# Patient Record
Sex: Male | Born: 1990 | Race: Black or African American | Hispanic: No | Marital: Single | Smoking: Current every day smoker
Health system: Southern US, Community
[De-identification: ages and names within clinical notes are randomized; demographics above are authoritative.]

---
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
## 2010-12-09 ENCOUNTER — Emergency Department (HOSPITAL_COMMUNITY)
Admission: EM | Admit: 2010-12-09 | Discharge: 2010-12-09 | Payer: Self-pay | Source: Home / Self Care | Admitting: Emergency Medicine

## 2010-12-10 ENCOUNTER — Emergency Department (HOSPITAL_COMMUNITY)
Admission: EM | Admit: 2010-12-10 | Discharge: 2010-12-10 | Payer: Self-pay | Source: Home / Self Care | Admitting: Emergency Medicine
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
## 2011-02-05 ENCOUNTER — Emergency Department (HOSPITAL_COMMUNITY)
Admission: EM | Admit: 2011-02-05 | Discharge: 2011-02-05 | Disposition: A | Discharge status: Home / Self Care | Payer: Self-pay | Attending: Emergency Medicine | Admitting: Emergency Medicine

## 2011-02-05 DIAGNOSIS — IMO0002 Reserved for concepts with insufficient information to code with codable children: Secondary | ICD-10-CM | POA: Insufficient documentation

## 2011-03-16 LAB — GC/CHLAMYDIA PROBE AMP, GENITAL
Chlamydia, DNA Probe: NEGATIVE
GC Probe Amp, Genital: NEGATIVE

## 2011-03-16 LAB — RPR: RPR Ser Ql: NONREACTIVE

## 2011-03-19 LAB — D-DIMER, QUANTITATIVE: D-Dimer, Quant: 0.27 ug/mL-FEU (ref 0.00–0.48)

## 2011-03-20 LAB — BASIC METABOLIC PANEL
BUN: 6 mg/dL (ref 6–23)
CO2: 26 mEq/L (ref 19–32)
Chloride: 99 mEq/L (ref 96–112)
Creatinine, Ser: 0.86 mg/dL (ref 0.4–1.5)
Potassium: 4 mEq/L (ref 3.5–5.1)

## 2011-03-20 LAB — CBC
HCT: 36.9 % (ref 36.0–49.0)
HCT: 37.4 % (ref 36.0–49.0)
HCT: 40.4 % (ref 36.0–49.0)
Hemoglobin: 12.7 g/dL (ref 12.0–16.0)
Hemoglobin: 15.4 g/dL (ref 12.0–16.0)
MCHC: 34.7 g/dL (ref 31.0–37.0)
MCV: 89.1 fL (ref 78.0–98.0)
MCV: 90.2 fL (ref 78.0–98.0)
Platelets: 217 10*3/uL (ref 150–400)
Platelets: 274 10*3/uL (ref 150–400)
Platelets: ADEQUATE 10*3/uL (ref 150–400)
RDW: 12.9 % (ref 11.4–15.5)
RDW: 13.4 % (ref 11.4–15.5)
RDW: 13.4 % (ref 11.4–15.5)
WBC: 7.7 10*3/uL (ref 4.5–13.5)
WBC: 7.8 10*3/uL (ref 4.5–13.5)

## 2011-03-20 LAB — TYPE AND SCREEN: ABO/RH(D): A POS

## 2011-03-20 LAB — DIFFERENTIAL
Basophils Absolute: 0.1 10*3/uL (ref 0.0–0.1)
Eosinophils Relative: 1 % (ref 0–5)
Lymphocytes Relative: 21 % — ABNORMAL LOW (ref 24–48)
Lymphs Abs: 1.6 10*3/uL (ref 1.1–4.8)
Monocytes Relative: 12 % — ABNORMAL HIGH (ref 3–11)

## 2011-03-20 LAB — PROTIME-INR
INR: 1 (ref 0.00–1.49)
Prothrombin Time: 13.2 s (ref 11.6–15.2)

## 2011-03-20 LAB — POCT I-STAT, CHEM 8
BUN: 8 mg/dL (ref 6–23)
Calcium, Ion: 1.08 mmol/L — ABNORMAL LOW (ref 1.12–1.32)
Chloride: 100 meq/L (ref 96–112)
Creatinine, Ser: 0.9 mg/dL (ref 0.4–1.5)
Glucose, Bld: 182 mg/dL — ABNORMAL HIGH (ref 70–99)
Potassium: 3.1 meq/L — ABNORMAL LOW (ref 3.5–5.1)
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
## 2011-09-02 LAB — CBC
HCT: 46.9
Hemoglobin: 16
RBC: 5.24
WBC: 12

## 2011-09-02 LAB — BLOOD GAS, ARTERIAL
Acid-base deficit: 0.6
Bicarbonate: 24.1 — ABNORMAL HIGH
Patient temperature: 37
TCO2: 20.9
pH, Arterial: 7.36

## 2011-09-02 LAB — BASIC METABOLIC PANEL
Calcium: 10
Glucose, Bld: 184 — ABNORMAL HIGH
Potassium: 3 — ABNORMAL LOW
Sodium: 139

## 2011-09-02 LAB — RAPID URINE DRUG SCREEN, HOSP PERFORMED
Amphetamines: NOT DETECTED
Barbiturates: NOT DETECTED
Benzodiazepines: NOT DETECTED
Cocaine: NOT DETECTED
Opiates: NOT DETECTED

## 2011-09-02 LAB — DIFFERENTIAL
Basophils Absolute: 0.1
Basophils Relative: 1
Eosinophils Relative: 1
Lymphocytes Relative: 35
Lymphs Abs: 4.2
Monocytes Relative: 8
Neutro Abs: 6.6

## 2011-09-02 LAB — ACETAMINOPHEN LEVEL: Acetaminophen (Tylenol), Serum: <10 — ABNORMAL LOW

## 2011-09-02 LAB — SALICYLATE LEVEL: Salicylate Lvl: <4

## 2021-07-11 ENCOUNTER — Encounter (HOSPITAL_COMMUNITY): Payer: Self-pay

## 2021-07-11 ENCOUNTER — Emergency Department (HOSPITAL_COMMUNITY)
Admission: EM | Admit: 2021-07-11 | Discharge: 2021-07-12 | Disposition: A | Discharge status: Home / Self Care | Payer: Self-pay | Attending: Emergency Medicine | Admitting: Emergency Medicine

## 2021-07-11 ENCOUNTER — Other Ambulatory Visit: Payer: Self-pay

## 2021-07-11 ENCOUNTER — Emergency Department (HOSPITAL_COMMUNITY)
Admission: EM | Admit: 2021-07-11 | Discharge: 2021-07-11 | Disposition: A | Discharge status: Home / Self Care | Payer: Self-pay | Attending: Emergency Medicine | Admitting: Emergency Medicine

## 2021-07-11 DIAGNOSIS — F17210 Nicotine dependence, cigarettes, uncomplicated: Secondary | ICD-10-CM | POA: Insufficient documentation

## 2021-07-11 DIAGNOSIS — R4689 Other symptoms and signs involving appearance and behavior: Secondary | ICD-10-CM

## 2021-07-11 DIAGNOSIS — F4325 Adjustment disorder with mixed disturbance of emotions and conduct: Secondary | ICD-10-CM | POA: Insufficient documentation

## 2021-07-11 DIAGNOSIS — Z5321 Procedure and treatment not carried out due to patient leaving prior to being seen by health care provider: Secondary | ICD-10-CM | POA: Insufficient documentation

## 2021-07-11 DIAGNOSIS — Y900 Blood alcohol level of less than 20 mg/100 ml: Secondary | ICD-10-CM | POA: Insufficient documentation

## 2021-07-11 DIAGNOSIS — R4182 Altered mental status, unspecified: Secondary | ICD-10-CM | POA: Insufficient documentation

## 2021-07-11 DIAGNOSIS — R45851 Suicidal ideations: Secondary | ICD-10-CM | POA: Insufficient documentation

## 2021-07-11 DIAGNOSIS — U071 COVID-19: Secondary | ICD-10-CM | POA: Insufficient documentation

## 2021-07-11 LAB — CBC WITH DIFFERENTIAL/PLATELET
Abs Immature Granulocytes: 0.02 10*3/uL (ref 0.00–0.07)
Basophils Absolute: 0.1 10*3/uL (ref 0.0–0.1)
Basophils Relative: 1 %
Eosinophils Absolute: 0.1 10*3/uL (ref 0.0–0.5)
Eosinophils Relative: 1 %
HCT: 47.4 % (ref 39.0–52.0)
Hemoglobin: 15.7 g/dL (ref 13.0–17.0)
Immature Granulocytes: 0 %
Lymphocytes Relative: 36 %
Lymphs Abs: 2.6 10*3/uL (ref 0.7–4.0)
MCH: 30.4 pg (ref 26.0–34.0)
MCHC: 33.1 g/dL (ref 30.0–36.0)
MCV: 91.9 fL (ref 80.0–100.0)
Monocytes Absolute: 0.6 10*3/uL (ref 0.1–1.0)
Monocytes Relative: 8 %
Neutro Abs: 3.8 10*3/uL (ref 1.7–7.7)
Neutrophils Relative %: 54 %
Platelets: 290 10*3/uL (ref 150–400)
RBC: 5.16 MIL/uL (ref 4.22–5.81)
RDW: 13.5 % (ref 11.5–15.5)
WBC: 7.1 10*3/uL (ref 4.0–10.5)
nRBC: 0 % (ref 0.0–0.2)

## 2021-07-11 LAB — COMPREHENSIVE METABOLIC PANEL
ALT: 13 U/L (ref 0–44)
AST: 18 U/L (ref 15–41)
Albumin: 4 g/dL (ref 3.5–5.0)
Alkaline Phosphatase: 59 U/L (ref 38–126)
Anion gap: 7 (ref 5–15)
BUN: 9 mg/dL (ref 6–20)
CO2: 24 mmol/L (ref 22–32)
Calcium: 8.8 mg/dL — ABNORMAL LOW (ref 8.9–10.3)
Chloride: 105 mmol/L (ref 98–111)
Creatinine, Ser: 0.87 mg/dL (ref 0.61–1.24)
GFR, Estimated: >60 mL/min (ref >60)
Glucose, Bld: 89 mg/dL (ref 70–99)
Potassium: 3.4 mmol/L — ABNORMAL LOW (ref 3.5–5.1)
Sodium: 136 mmol/L (ref 135–145)
Total Bilirubin: 1.4 mg/dL — ABNORMAL HIGH (ref 0.3–1.2)
Total Protein: 7.3 g/dL (ref 6.5–8.1)

## 2021-07-11 LAB — ETHANOL: Alcohol, Ethyl (B): <10 mg/dL (ref <10)

## 2021-07-11 NOTE — ED Notes (Signed)
Patient left. Unable to redirect patient to stay.

## 2021-07-11 NOTE — ED Triage Notes (Signed)
Pt brought in from home by cousin, who says he is living between her house and her sister's house. 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 Pt admits to having thoughts of HI, but will not elaborate. Family says they have taken out IVC paperwork

## 2021-07-11 NOTE — ED Notes (Signed)
Pt allowed lab to obtain blood work at this time. Pt continues to rest in the bed

## 2021-07-11 NOTE — ED Triage Notes (Signed)
Pt brought in from home by cousin, who says he is living between her house and her sister's house. 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 Pt admits to having thoughts of HI, but will not elaborate.

## 2021-07-11 NOTE — ED Notes (Signed)
Pt changed into burgundy scrubs. Personal belongings labeled et locked in locker per protocol. Scanned by security.

## 2021-07-11 NOTE — ED Provider Notes (Signed)
[facility] EMERGENCY DEPARTMENT Provider Note   CSN: [id number] Arrival date & time: 07/11/21  1859     History Chief Complaint  Patient presents with   Psychiatric Evaluation    [name] is a 30 y.o. male.  HPI Seen by me at 7:48 PM.  At this time he is lying quietly on a stretcher in the hallway.  I asked him something was bothering him and he shook his head yes but would not elaborate.  He would not answer any further questions.    Level 5 caveat-altered mental status    History reviewed. No pertinent past medical history.  There are no problems to display for this patient.   History reviewed. No pertinent surgical history.     No family history on file.  Social History   Tobacco Use   Smoking status: Every Day    Packs/day: 0.50    Types: Cigarettes   Smokeless tobacco: Never  Vaping Use   Vaping Use: Never used  Substance Use Topics   Alcohol use: Not Currently   Drug use: Yes    Types: Marijuana    Home Medications Prior to Admission medications   Not on File    Allergies    Patient has no known allergies.  Review of Systems   Review of Systems  Unable to perform ROS: Mental status change   Physical Exam Updated Vital Signs BP 129/79 (BP Location: Right Arm)   Pulse 77   Temp 98.7 F (37.1 C) (Oral)   Resp 18   Ht 5\' 11"  (1.803 m)   Wt 73.5 kg   SpO2 97%   BMI 22.59 kg/m   Physical Exam Vitals and nursing note reviewed.  Constitutional:      General: He is not in acute distress.    Appearance: He is well-developed.  HENT:     Head: Normocephalic and atraumatic.     Right Ear: External ear normal.     Left Ear: External ear normal.  Eyes:     Conjunctiva/sclera: Conjunctivae normal.  Neck:     Trachea: Phonation normal.  Cardiovascular:     Rate and Rhythm: Normal rate.  Pulmonary:     Effort: Pulmonary effort is normal.  Abdominal:     General: There is no distension.  Musculoskeletal:        General: Normal  range of motion.     Cervical back: Normal range of motion and neck supple.  Skin:    General: Skin is warm and dry.  Neurological:     Mental Status: He is alert and oriented to person, place, and time.     Cranial Nerves: No cranial nerve deficit.     Motor: No abnormal muscle tone.     Coordination: Coordination normal.  Psychiatric:        Attention and Perception: He is inattentive.        Mood and Affect: Mood is anxious.        Speech: He is noncommunicative.        Behavior: Behavior is agitated.        Cognition and Memory: Cognition is impaired.        Judgment: Judgment is impulsive.    ED Results / Procedures / Treatments   Labs (all labs ordered are listed, but only abnormal results are displayed) Labs Reviewed  CBC WITH DIFFERENTIAL/PLATELET    EKG None  Radiology No results found.  Procedures Procedures   Medications Ordered in ED  Medications - No data to display  ED Course  I have reviewed the triage vital signs and the nursing notes.  Pertinent labs & imaging results that were available during my care of the patient were reviewed by me and considered in my medical decision making (see chart for details).    MDM Rules/Calculators/A&P                            Patient Vitals for the past 24 hrs:  BP Temp Temp src Pulse Resp SpO2 Height Weight  07/11/21 1916 129/79 98.7 F (37.1 C) Oral 77 18 97 % 5\' 11"  (1.803 m) 73.5 kg      Medical Decision Making:  This patient is presenting for evaluation of unusual behavior according to family member who brought him here, which does require a range of treatment options, and is a complaint that involves a moderate risk of morbidity and mortality. The differential diagnoses include acute intoxication, psychiatric illness. I decided to review old records, and in summary patient without recent history here, presenting with concerning psychiatric symptoms.  He is a reluctant historian.  I  Critical  Interventions-clinical evaluation.  Testing ordered  After These Interventions, the Patient was reevaluated and was found to have eloped  CRITICAL CARE-no Performed by:  Nursing Notes Reviewed/ Care Coordinated Applicable Imaging Reviewed Interpretation of Laboratory Data incorporated into ED treatment      Final Clinical Impression(s) / ED Diagnoses Final diagnoses:  Abnormal behavior    Rx / DC Orders ED Discharge Orders     None        Mancel Bale, MD 07/11/21 2146

## 2021-07-12 DIAGNOSIS — F4325 Adjustment disorder with mixed disturbance of emotions and conduct: Secondary | ICD-10-CM

## 2021-07-12 LAB — RAPID URINE DRUG SCREEN, HOSP PERFORMED
Amphetamines: NOT DETECTED
Barbiturates: NOT DETECTED
Benzodiazepines: NOT DETECTED
Cocaine: NOT DETECTED
Opiates: NOT DETECTED
Tetrahydrocannabinol: POSITIVE — AB

## 2021-07-12 LAB — RESP PANEL BY RT-PCR (FLU A&B, COVID) ARPGX2
Influenza A by PCR: NEGATIVE
Influenza B by PCR: NEGATIVE
SARS Coronavirus 2 by RT PCR: POSITIVE — AB
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
## 2021-07-12 NOTE — Progress Notes (Signed)
CSW contacted [facility] in [place] and establish a intake appointment for the Pt for July 22, 2021 at 11 AM. CSW added this information to the AVS for discharge.   Damita Dunnings, MSW, LCSW-A  3:12 PM 07/12/2021

## 2021-07-12 NOTE — Consult Note (Signed)
Telepsych Consultation   Reason for Consult:  Psychiatry Reassessment Referring Physician:   Location of Patient: [facility] Location of Provider: Behavioral Health TTS Department  Patient Identification: [name] MRN:  [id number] Principal Diagnosis: Adjustment disorder with mixed disturbance of emotions and conduct Diagnosis:  Principal Problem:   Adjustment disorder with mixed disturbance of emotions and conduct   Total Time spent with patient: 30 minutes  Subjective:  "I am ready to go home, I am not going to hurt myself or anyone else."   HPI: [name] is a 30 y.o. male patient. Patient states "I do have some days when it is hard to keep my head up and yesterday was one of them." He verbalizes that he girlfriend did not answer her phone when he called yesterday resulting in him taking his aunt's car without permission. He was petitioned for involuntary commitment by family member after making suicidal statement.  He endorses he did make a suicidal statement, denies that he had any intent to harm self.  He states "in fact I am a Muslim and we do not do suicide."  Patient is Publishing rights manager. He is seated in assessment area, no acute distress.  He is alert and oriented, pleasant and cooperative during assessment. He reports euthymic mood with congruent affect. He makes jokes and shares laughs during assessment. He denies suicidal and homicidal ideations.  He denies any history of suicide attempts, denies history of self-harm.  He contracts verbally for safety with this Clinical research associate.  He has normal speech and behavior. He denies both auditory and visual hallucinations.  Patient is able to converse coherently with goal-directed thoughts and no distractibility or preoccupation.  He denies paranoia.  Objectively there is no evidence of psychosis/mania or delusional thinking.  [name] reports recent stressors include not speaking with his girlfriend on yesterday and  recently released from jail after 6 years in April 2022. He reports after speaking with girlfriend today his mood is much improved.  He denies any current outpatient psychiatry follow-up.  He denies any current medications.  He is open to initiation of medication to treat anxiety.  Currently he reports using marijuana daily, uses marijuana in an attempt to address his anxiety.  He is insightful regarding marijuana and it's affecting his mood, states "I feel like it brings me down."  He denies substance use aside from marijuana.  Discussed medication hydroxyzine, discussed side effects, patient verbalizes agreement with plan to initiate hydroxyzine.  He agrees to plan for follow-up with outpatient psychiatry.  He resides in [place] with his aunt and uncle.  He denies access to weapons.  He reports he is employed in [employer] "I like being outside."  He endorses average sleep and appetite.  Patient offered support and encouragement.  He gives verbal consent to speak with his aunt, [name].  Patient's [name] [phone] reports concern that patient needs structure after being released from prison in April 2022. Per aunt, [name] should have been released to halfway house initially and has a court order that permits him to reside in a halfway house but this has not been upheld by Engineer, drilling, Essie Hart [phone]. [name] has been in contact with probation officer frequently in an effort to place [name] in a halfway house. Patient's aunt reports he has been basically homeless and living with her sister for approximately 3 weeks. She reports concern that patient has engaged in reckless behavior continuously since discharge from prison.  She  states "he is manipulative and will tell you what you need to hear."  Additionally she reports Tyson BabinskiJarod is "using drugs."    Past Psychiatric History: none reported  Risk to Self:   Denies Risk to Others:   Denies Prior Inpatient  Therapy:   none reported Prior Outpatient Therapy:   None reported  Past Medical History: History reviewed. No pertinent past medical history. History reviewed. No pertinent surgical history. Family History: No family history on file. Family Psychiatric  History: brother- alcohol use disorder, substance use disorder Social History:  Social History   Substance and Sexual Activity  Alcohol Use Not Currently     Social History   Substance and Sexual Activity  Drug Use Yes   Types: Marijuana    Social History   Socioeconomic History   Marital status: Single    Spouse name: Not on file   Number of children: Not on file   Years of education: Not on file   Highest education level: Not on file  Occupational History   Not on file  Tobacco Use   Smoking status: Every Day    Packs/day: 0.50    Types: Cigarettes   Smokeless tobacco: Never  Vaping Use   Vaping Use: Never used  Substance and Sexual Activity   Alcohol use: Not Currently   Drug use: Yes    Types: Marijuana   Sexual activity: Not on file  Other Topics Concern   Not on file  Social History Narrative   Not on file   Social Determinants of Health   Financial Resource Strain: Not on file  Food Insecurity: Not on file  Transportation Needs: Not on file  Physical Activity: Not on file  Stress: Not on file  Social Connections: Not on file   Additional Social History:    Allergies:  No Known Allergies  Labs:  Results for orders placed or performed during the hospital encounter of 07/11/21 (from the past 48 hour(s))  Urine rapid drug screen (hosp performed)     Status: Abnormal   Collection Time: 07/11/21  9:48 PM  Result Value Ref Range   Opiates NONE DETECTED NONE DETECTED   Cocaine NONE DETECTED NONE DETECTED   Benzodiazepines NONE DETECTED NONE DETECTED   Amphetamines NONE DETECTED NONE DETECTED   Tetrahydrocannabinol POSITIVE (A) NONE DETECTED   Barbiturates NONE DETECTED NONE DETECTED    Comment:  (NOTE) DRUG SCREEN FOR MEDICAL PURPOSES ONLY.  IF CONFIRMATION IS NEEDED FOR ANY PURPOSE, NOTIFY LAB WITHIN 5 DAYS.  LOWEST DETECTABLE LIMITS FOR URINE DRUG SCREEN Drug Class                     Cutoff (ng/mL) Amphetamine and metabolites    1000 Barbiturate and metabolites    200 Benzodiazepine                 200 Tricyclics and metabolites     300 Opiates and metabolites        300 Cocaine and metabolites        300 THC                            50 Performed at [facility], [address] [place], [place] [address]   Comprehensive metabolic panel     Status: Abnormal   Collection Time: 07/11/21 10:01 PM  Result Value Ref Range   Sodium 136 135 - 145 mmol/L   Potassium 3.4 (L)  3.5 - 5.1 mmol/L   Chloride 105 98 - 111 mmol/L   CO2 24 22 - 32 mmol/L   Glucose, Bld 89 70 - 99 mg/dL    Comment: Glucose reference range applies only to samples taken after fasting for at least 8 hours.   BUN 9 6 - 20 mg/dL   Creatinine, Ser 0.34 0.61 - 1.24 mg/dL   Calcium 8.8 (L) 8.9 - 10.3 mg/dL   Total Protein 7.3 6.5 - 8.1 g/dL   Albumin 4.0 3.5 - 5.0 g/dL   AST 18 15 - 41 U/L   ALT 13 0 - 44 U/L   Alkaline Phosphatase 59 38 - 126 U/L   Total Bilirubin 1.4 (H) 0.3 - 1.2 mg/dL   GFR, Estimated >74 >25 mL/min    Comment: (NOTE) Calculated using the CKD-EPI Creatinine Equation (2021)    Anion gap 7 5 - 15    Comment: Performed at [facility], [address], [place], [place] [address]  Ethanol     Status: None   Collection Time: 07/11/21 10:01 PM  Result Value Ref Range   Alcohol, Ethyl (B) <10 <10 mg/dL    Comment: (NOTE) Lowest detectable limit for serum alcohol is 10 mg/dL.  For medical purposes only. Performed at [facility], [address], [place], [place] [address]   CBC with Differential     Status: None   Collection Time: 07/11/21 10:01 PM  Result Value Ref Range   WBC 7.1 4.0 - 10.5 K/uL   RBC 5.16 4.22 - 5.81 MIL/uL   Hemoglobin 15.7 13.0 - 17.0 g/dL   HCT 32.9 51.8 -  84.1 %   MCV 91.9 80.0 - 100.0 fL   MCH 30.4 26.0 - 34.0 pg   MCHC 33.1 30.0 - 36.0 g/dL   RDW 66.0 63.0 - 16.0 %   Platelets 290 150 - 400 K/uL   nRBC 0.0 0.0 - 0.2 %   Neutrophils Relative % 54 %   Neutro Abs 3.8 1.7 - 7.7 K/uL   Lymphocytes Relative 36 %   Lymphs Abs 2.6 0.7 - 4.0 K/uL   Monocytes Relative 8 %   Monocytes Absolute 0.6 0.1 - 1.0 K/uL   Eosinophils Relative 1 %   Eosinophils Absolute 0.1 0.0 - 0.5 K/uL   Basophils Relative 1 %   Basophils Absolute 0.1 0.0 - 0.1 K/uL   Immature Granulocytes 0 %   Abs Immature Granulocytes 0.02 0.00 - 0.07 K/uL    Comment: Performed at [facility], [address], [place], [place] [address]  Resp Panel by RT-PCR (Flu A&B, Covid) Nasopharyngeal Swab     Status: Abnormal   Collection Time: 07/12/21  1:17 AM   Specimen: Nasopharyngeal Swab; Nasopharyngeal(NP) swabs in vial transport medium  Result Value Ref Range   SARS Coronavirus 2 by RT PCR POSITIVE (A) NEGATIVE    Comment: RESULT CALLED TO, READ BACK BY AND VERIFIED WITH: BELINDA BROWN 07/12/21 @ 0306 BY S BEARD (NOTE) SARS-CoV-2 target nucleic acids are DETECTED.  The SARS-CoV-2 RNA is generally detectable in upper respiratory specimens during the acute phase of infection. Positive results are indicative of the presence of the identified virus, but do not rule out bacterial infection or co-infection with other pathogens not detected by the test. Clinical correlation with patient history and other diagnostic information is necessary to determine patient infection status. The expected result is Negative.  Fact Sheet for Patients: BloggerCourse.com  Fact Sheet for Healthcare Providers: SeriousBroker.it  This test is  not yet approved or cleared by the [place] and  has been authorized for detection and/or diagnosis of SARS-CoV-2 by FDA under an Emergency Use Authorization (EUA).  This EUA will remain in effect  (meaning this test c an be used) for the duration of  the COVID-19 declaration under Section 564(b)(1) of the Act, 21 U.S.C. section 360bbb-3(b)(1), unless the authorization is terminated or revoked sooner.     Influenza A by PCR NEGATIVE NEGATIVE   Influenza B by PCR NEGATIVE NEGATIVE    Comment: (NOTE) The Xpert Xpress SARS-CoV-2/FLU/RSV plus assay is intended as an aid in the diagnosis of influenza from Nasopharyngeal swab specimens and should not be used as a sole basis for treatment. Nasal washings and aspirates are unacceptable for Xpert Xpress SARS-CoV-2/FLU/RSV testing.  Fact Sheet for Patients: BloggerCourse.com  Fact Sheet for Healthcare Providers: SeriousBroker.it  This test is not yet approved or cleared by the [place] FDA and has been authorized for detection and/or diagnosis of SARS-CoV-2 by FDA under an Emergency Use Authorization (EUA). This EUA will remain in effect (meaning this test can be used) for the duration of the COVID-19 declaration under Section 564(b)(1) of the Act, 21 U.S.C. section 360bbb-3(b)(1), unless the authorization is terminated or revoked.  Performed at [facility], [address], [place], [place] [address]     Medications:  No current facility-administered medications for this encounter.   No current outpatient medications on file.    Musculoskeletal: Strength & Muscle Tone: within normal limits Gait & Station: normal Patient leans: N/A          Psychiatric Specialty Exam:  Presentation  General Appearance:  No data recorded Eye Contact: No data recorded Speech: No data recorded Speech Volume: No data recorded Handedness: No data recorded  Mood and Affect  Mood: No data recorded Affect: No data recorded  Thought Process  Thought Processes: No data recorded Descriptions of Associations:No data recorded Orientation:No data recorded Thought  Content:No data recorded History of Schizophrenia/Schizoaffective disorder:No data recorded Duration of Psychotic Symptoms:No data recorded Hallucinations:No data recorded Ideas of Reference:No data recorded Suicidal Thoughts:No data recorded Homicidal Thoughts:No data recorded  Sensorium  Memory: No data recorded Judgment: No data recorded Insight: No data recorded  Executive Functions  Concentration: No data recorded Attention Span: No data recorded Recall: No data recorded Fund of Knowledge: No data recorded Language: No data recorded  Psychomotor Activity  Psychomotor Activity: No data recorded  Assets  Assets: No data recorded  Sleep  Sleep: No data recorded   Physical Exam: Physical Exam Vitals and nursing note reviewed.  Constitutional:      Appearance: Normal appearance. He is normal weight.  HENT:     Head: Normocephalic and atraumatic.     Nose: Nose normal.  Cardiovascular:     Rate and Rhythm: Normal rate.  Pulmonary:     Effort: Pulmonary effort is normal.  Musculoskeletal:        General: Normal range of motion.     Cervical back: Normal range of motion and neck supple.  Neurological:     Mental Status: He is alert and oriented to person, place, and time.  Psychiatric:        Attention and Perception: Attention and perception normal.        Mood and Affect: Mood and affect normal.        Speech: Speech normal.        Behavior: Behavior normal. Behavior is cooperative.  Thought Content: Thought content normal.        Cognition and Memory: Cognition and memory normal.        Judgment: Judgment normal.   Review of Systems  Constitutional: Negative.   HENT: Negative.    Eyes: Negative.   Respiratory: Negative.    Cardiovascular: Negative.   Gastrointestinal: Negative.   Genitourinary: Negative.   Musculoskeletal: Negative.   Skin: Negative.   Neurological: Negative.   Endo/Heme/Allergies: Negative.    Psychiatric/Behavioral:  Positive for substance abuse.   Blood pressure 132/73, pulse 60, temperature 98.3 F (36.8 C), temperature source Oral, resp. rate 20, height  (1.803 m), weight 73.5 kg, SpO2 100 %. Body mass index is 22.6 kg/m.  Treatment Plan Summary: Plan patient reviewed with Dr Nelly Rout Follow up with outpatient psychiatry, resources provided. Recommend consider medication: -Hydroxyzine 25 mg 3 times daily as needed/anxiety  Follow-up with outpatient substance use treatment resources provided.   Disposition: No evidence of imminent risk to self or others at present.   Patient does not meet criteria for psychiatric inpatient admission. Supportive therapy provided about ongoing stressors. Discussed crisis plan, support from social network, calling 911, coming to the Emergency Department, and calling Suicide Hotline.  This service was provided via telemedicine using a 2-way, interactive audio and video technology.  Names of all persons participating in this telemedicine service and their role in this encounter. Name: Shelbie Ammons Role: Patient  Name: Elliot Cousin telephone Role: Patient's cousin  Name: Doran Heater Role: FNP  Name: Dr Lucianne Muss Role: Psychiatrist    Lenard Lance, FNP 07/12/2021 2:25 PM

## 2021-07-12 NOTE — ED Notes (Signed)
Dr. Hyacinth Meeker notified patient that he was being discharged for follow-up as outpatient

## 2021-07-12 NOTE — ED Notes (Signed)
Pt scratches cleaned and dressed. Pt was laughing with nurse proud of his behavior.

## 2021-07-12 NOTE — ED Notes (Signed)
Patient once again is at door stating he was not  using bathroom and not bedside commode.   He also stated he was Walking out   Security and police were called

## 2021-07-12 NOTE — ED Provider Notes (Signed)
[facility]-EMERGENCY DEPT [facility] Emergency Department Provider Note MRN:  [id number]  Arrival date & time: 07/12/21     Chief Complaint   Psychiatric Evaluation   History of Present Illness   [name] is a 30 y.o. year-old male with no pertinent past medical history presenting to the ED with chief complaint of psychiatric evaluation.  IVC paperwork obtained and brought here for evaluation.  Per the paperwork, patient was planning on robbing a bank, was planning on starting a fight or assaulting someone, was planning on killing someone or being killed.  Explained that he would not stop until he was killed.  Here in the emergency department patient refuses to cooperate or answer questions.  I was unable to obtain an accurate HPI, PMH, or ROS due to the patient's unstable psychiatric condition.  Level 5 caveat.  Review of Systems  Positive for SI, HI.  Patient's Health History   History reviewed. No pertinent past medical history.  History reviewed. No pertinent surgical history.  No family history on file.  Social History   Socioeconomic History   Marital status: Single    Spouse name: Not on file   Number of children: Not on file   Years of education: Not on file   Highest education level: Not on file  Occupational History   Not on file  Tobacco Use   Smoking status: Every Day    Packs/day: 0.50    Types: Cigarettes   Smokeless tobacco: Never  Vaping Use   Vaping Use: Never used  Substance and Sexual Activity   Alcohol use: Not Currently   Drug use: Yes    Types: Marijuana   Sexual activity: Not on file  Other Topics Concern   Not on file  Social History Narrative   Not on file   Social Determinants of Health   Financial Resource Strain: Not on file  Food Insecurity: Not on file  Transportation Needs: Not on file  Physical Activity: Not on file  Stress: Not on file  Social Connections: Not on file  Intimate Partner Violence: Not on file      Physical Exam   Vitals:   07/11/21 2048 07/11/21 2231  BP: 129/79 107/67  Pulse: 75 (!) 53  Resp: 18 16  Temp: 98.3 F (36.8 C)   SpO2: 97% 98%    CONSTITUTIONAL: Well-appearing, NAD NEURO:  Alert and oriented x 3, no focal deficits EYES:  eyes equal and reactive ENT/NECK:  no LAD, no JVD CARDIO: Regular rate, well-perfused, normal S1 and S2 PULM:  CTAB no wheezing or rhonchi GI/GU:  normal bowel sounds, non-distended, non-tender MSK/SPINE:  No gross deformities, no edema SKIN:  no rash, atraumatic PSYCH: Withdrawn speech and behavior  *Additional and/or pertinent findings included in MDM below  Diagnostic and Interventional Summary    EKG Interpretation  Date/Time:    Ventricular Rate:    PR Interval:    QRS Duration:   QT Interval:    QTC Calculation:   R Axis:     Text Interpretation:         Labs Reviewed  COMPREHENSIVE METABOLIC PANEL - Abnormal; Notable for the following components:      Result Value   Potassium 3.4 (*)    Calcium 8.8 (*)    Total Bilirubin 1.4 (*)    All other components within normal limits  RESP PANEL BY RT-PCR (FLU A&B, COVID) ARPGX2  ETHANOL  CBC WITH DIFFERENTIAL/PLATELET  RAPID URINE DRUG SCREEN, HOSP PERFORMED  No orders to display    Medications - No data to display   Procedures  /  Critical Care Procedures  ED Course and Medical Decision Making  I have reviewed the triage vital signs, the nursing notes, and pertinent available records from the EMR.  Listed above are laboratory and imaging tests that I personally ordered, reviewed, and interpreted and then considered in my medical decision making (see below for details).  Concerning violent plans with SI.  The IVC paperwork.  Patient is sitting calmly here in the emergency department.  First exam paperwork complete, patient is medically cleared, awaiting TTS evaluation.  Signed out to default provider.       Elmer Sow. Pilar Plate, MD [facility] Emergency  Medicine [facility] [email or web address]  Final Clinical Impressions(s) / ED Diagnoses     ICD-10-CM   1. Suicidal ideation  R45.851       ED Discharge Orders     None        Discharge Instructions Discussed with and Provided to Patient:   Discharge Instructions   None       Sabas Sous, MD 07/12/21 [id number]

## 2021-07-12 NOTE — ED Notes (Signed)
Pt ran outside after he went to bathroom. Redirection was not helpful to get him back to the room. Nurse followed pt to make sure he was safe. Nurse noted outside in the parkinglot that it was 1500 hrs. Pt laughed and looked and nurse and stated' We will see how many it takes to get me back in there" . Nurse attempted redirection but was not effective.  Police brought patient back into the room. Police with pt talking at this time.

## 2021-07-12 NOTE — ED Notes (Signed)
Pt requesting security. Nurse went into room to discuss needs for security. Nurse explained to pt that," yes he is IVC'D and can not leave " and " yes he had covid, "  so we would need to put a bedside commode in his room to prevent the spread of covid. Pt was calm and accepting.

## 2021-07-12 NOTE — BH Assessment (Signed)
Clinician spoke to [name] with [facility] [employer] to discuss information from pt. Pt told clinician that he loved [name], "what if somebody kill her." Pt reports, his ex lives on [address] and her phone number is [phone]. Clinician expressed to [name] the ex needs to Duty to Warn.   Clinician discussed Duty to Warn with the pt. Pt reports, he will not hurt her, he was just using the system.    Redmond Pulling, MS, [facility], [facility] Triage Specialist [phone]

## 2021-07-12 NOTE — ED Notes (Signed)
Pt belongings returned to him from locker.

## 2021-07-12 NOTE — ED Notes (Signed)
Patient came to door and stated before he became a psych patient he was going to come in because his back hurt.   Dr. Hyacinth Meeker and nurse advised.

## 2021-07-12 NOTE — ED Notes (Signed)
Patient ran and [place] PD brought patient back handcuffed

## 2021-07-12 NOTE — ED Notes (Signed)
Patient used phone again.  Patient advised this was his second call for the day Patient verbalized understanding

## 2021-07-12 NOTE — ED Provider Notes (Signed)
I have personally spoken with the psychiatrist, unfortunately I was contacted about 10 minutes after the patient was cleared, he decided to run out the front doors to get away at that point, he stated he was not suicidal, psychiatry is adamant that this patient is not a danger to himself or others, that his behavior is not being driven by a psychiatric problem but just having generally poor choices, at this time the patient's IVC will be overturned   Eber Hong, MD 07/12/21 [phone]

## 2021-07-12 NOTE — Progress Notes (Signed)
TTS requested that [facility] fax psych assessment, ed provider notes, and AVS to [facility] at [phone].

## 2021-07-12 NOTE — ED Notes (Signed)
Pt moved to room 16 due to positive covid result. Pt visible through glass doors. Room also locked down

## 2021-07-12 NOTE — ED Notes (Signed)
Lunch tray given to patient   Patient resting comfortably

## 2021-07-12 NOTE — ED Notes (Signed)
Pt ambulated to restroom without any incident pt also left urine that was sent to lab for testing. [name]
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
## 2021-07-12 NOTE — ED Notes (Signed)
Patient used phone (1st call for day)  Breakast tray was served.  Finger foods only
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
## 2021-07-12 NOTE — Discharge Instructions (Addendum)
Patient has been schedule for an intake with [facility] at their [place] location. See additional information below:  [facility]  [address] [place], [place] [address] [phone] APPOINTMENT TIME IS: Monday July 22, 2021 at 11 AM PLEASE BRING:   Photo ID   SSI Card  Proof of Income (if able)   Insurance Card (if able)   Substance Abuse Treatment Resources listed Below:  [facility] - Admissions are currently completed Monday through Friday at 8am; both appointments and walk-ins are accepted.  Any individual that is a [facility] resident may present for a substance abuse screening and assessment for admission.  A person may be referred by numerous sources or self-refer.   Potential clients will be screened for medical necessity and appropriateness for the program.  Clients must meet criteria for high-intensity residential treatment services.  If clinically appropriate, a client will continue with the comprehensive clinical assessment and intake process, as well as enrollment in the [facility].  Address: [address] [place], [place] [address] Admin Hours: Bettye Boeck to [facility] Hours: 24/7 Phone: [phone] Fax: [phone]  [facility] - [facility] Address: [address] [place], [place], [place] [address] Behavioral Health Urgent Care [facility]) Hours: 24/7 Phone: [phone] Fax: [phone]  [facility]): (offers outpatient therapy and intensive outpatient substance abuse therapy).  [address] [place], [place] [address] Phone: [phone]  [facility]: Offers FREE recovery skills classes, support groups, 1:1 Peer Support, and Compeer Classes. [address], [place], [place] [address] Phone: [phone] (Call to complete intake).   [facility] [address] [place], [place] [address] Phone: [phone] ext [phone] The  [facility] provides food, shelter and other programs and services to the homeless men of [place] through our [employer] program.  By offering safe shelter, three meals a day, clean clothing, Biblical counseling, financial planning, vocational training, GED/education and employment assistance, we've helped mend the shattered lives of many homeless men since opening in 1974.  We have approximately 267 beds available, with a max of 312 beds including mats for emergency situations and currently house an average of 270 men a night.  Prospective Client Check-In Information Photo ID Required (State/ Out of State/ [facility]) - if photo ID is not available, clients are required to have a printout of a police/sheriff's criminal history report. Help out with chores around the [facility]. No sex offender of any type (pending, charged, registered and/or any other sex related offenses) will be permitted to check in. Must be willing to abide by all rules, regulations, and policies established by the [employer]. The following will be provided - shelter, food, clothing, and biblical counseling. If you or someone you know is in need of assistance at our [facility] shelter in [place], [place], please call [phone] ext. [phone].  [facility]-will provide timely access to mental health services for children and adolescents (4-17) and adults presenting in a mental health crisis. The program is designed for those who need urgent Behavioral Health or Substance Use treatment and are not experiencing a medical crisis that would typically require an emergency room visit.    [address] [place], [place] [address] Phone: [phone] [email or web address]  [facility]: Phone#: [phone]  The [facility] SA Intensive Outpatient Program (SAIOP) means structured individual and group addiction activities and services that are provided  at an outpatient program designed  to assist adult and adolescent consumers to begin recovery and learn skills for recovery maintenance. The [facility] [facility] SAIOP program is offered at least 3 hours a day, 3 days a week.SAIOP services shall include a structured program consisting of, but not limited to, the following services: Individual counseling and support; Group counseling and support; Family counseling, training or support; Biochemical assays to identify recent drug use (e.g., urine drug screens); Strategies for relapse prevention to include community and social support systems in treatment; Life skills; Crisis contingency planning; Disease Management; and Treatment support activities that have been adapted or specifically designed for persons with physical disabilities, or persons with co-occurring disorders of mental illness and substance abuse/dependence or mental retardation/developmental disability and substance abuse/dependence. Phone: [phone]  Address:   [facility] will also offer the following outpatient services: (Monday through Friday 8am-5pm)   Partial Hospitalization Program (PHP) Substance Abuse Intensive Outpatient Program (SA-IOP) Group Therapy Medication Management Peer Living Room We also provide (24/7):  Assessments: Our mental health clinician and providers will conduct a focused mental health evaluation, assessing for immediate safety concerns and further mental health needs. Referral: Our team will provide resources and help connect to community based mental health treatment, when indicated, including psychotherapy, psychiatry, and other specialized behavioral health or substance use disorder services (for those not already in treatment). Transitional Care: Our team providers in person bridging and/or telephonic follow-up during the patient's transition to outpatient services.  [facility] 24-Hour Call Center: [phone] Behavioral Health  Crisis Line: [phone]   You have been cleared for discharge - return to the ER for worsening symptoms.

## 2021-07-12 NOTE — ED Notes (Signed)
TTS at this time. 

## 2021-07-12 NOTE — ED Notes (Signed)
IVC paperwork faxed to [facility] @ [phone] and [phone]

## 2021-07-12 NOTE — Progress Notes (Signed)
Patient information has been sent to [facility] via secure chat to review for potential admission. Patient meets inpatient criteria per Melbourne Abts, PA-C.   Situation ongoing, CSW will continue to monitor progress.    Signed:  Damita Dunnings, MSW, LCSW-A  07/12/2021 8:45 AM

## 2021-07-12 NOTE — ED Notes (Signed)
Pt finished tts. Pt awake walking in room pacing.  Pt also provided toothbrush and tooth paste for oral care
